# Patient Record
Sex: Female | Born: 1980 | Race: Black or African American | Hispanic: No | Marital: Single | Smoking: Never smoker
Health system: Southern US, Community
[De-identification: ages and names within clinical notes are randomized; demographics above are authoritative.]

## PROBLEM LIST (undated history)

## (undated) DIAGNOSIS — G4730 Sleep apnea, unspecified: Secondary | ICD-10-CM

## (undated) DIAGNOSIS — T7840XA Allergy, unspecified, initial encounter: Secondary | ICD-10-CM

## (undated) DIAGNOSIS — D649 Anemia, unspecified: Secondary | ICD-10-CM

## (undated) HISTORY — DX: Anemia, unspecified: D64.9

## (undated) HISTORY — DX: Allergy, unspecified, initial encounter: T78.40XA

## (undated) HISTORY — DX: Sleep apnea, unspecified: G47.30

## (undated) HISTORY — PX: HAND SURGERY: SHX662

---

## 2003-09-12 ENCOUNTER — Other Ambulatory Visit: Admission: RE | Admit: 2003-09-12 | Discharge: 2003-09-12 | Payer: Self-pay | Admitting: Gynecology

## 2004-01-07 ENCOUNTER — Other Ambulatory Visit: Admission: RE | Admit: 2004-01-07 | Discharge: 2004-01-07 | Payer: Self-pay | Admitting: Gynecology

## 2005-02-24 ENCOUNTER — Other Ambulatory Visit: Admission: RE | Admit: 2005-02-24 | Discharge: 2005-02-24 | Payer: Self-pay | Admitting: Gynecology

## 2005-03-17 ENCOUNTER — Other Ambulatory Visit: Admission: RE | Admit: 2005-03-17 | Discharge: 2005-03-17 | Payer: Self-pay | Admitting: Gynecology

## 2005-03-25 ENCOUNTER — Encounter: Admission: RE | Admit: 2005-03-25 | Discharge: 2005-03-25 | Payer: Self-pay | Admitting: Gynecology

## 2005-04-15 ENCOUNTER — Encounter: Admission: RE | Admit: 2005-04-15 | Discharge: 2005-04-15 | Payer: Self-pay | Admitting: Gynecology

## 2005-12-24 ENCOUNTER — Other Ambulatory Visit: Admission: RE | Admit: 2005-12-24 | Discharge: 2005-12-24 | Payer: Self-pay | Admitting: Obstetrics and Gynecology

## 2006-02-05 ENCOUNTER — Ambulatory Visit (HOSPITAL_BASED_OUTPATIENT_CLINIC_OR_DEPARTMENT_OTHER): Admission: RE | Admit: 2006-02-05 | Discharge: 2006-02-05 | Payer: Self-pay | Admitting: Surgery

## 2006-02-05 ENCOUNTER — Encounter (INDEPENDENT_AMBULATORY_CARE_PROVIDER_SITE_OTHER): Payer: Self-pay | Admitting: *Deleted

## 2007-08-14 ENCOUNTER — Ambulatory Visit: Payer: Self-pay | Admitting: Internal Medicine

## 2008-01-30 ENCOUNTER — Ambulatory Visit: Payer: Self-pay | Admitting: Family Medicine

## 2009-05-07 IMAGING — US ABDOMEN ULTRASOUND
1 series · 17 of 25 positions shown · non-contrast
Comparison: none

REASON FOR EXAM: LUQ Pain Pelvic Pain
COMMENTS:

[Series 1: abdomen ultrasound · 17 of 65 slices shown]
[im 1/65]
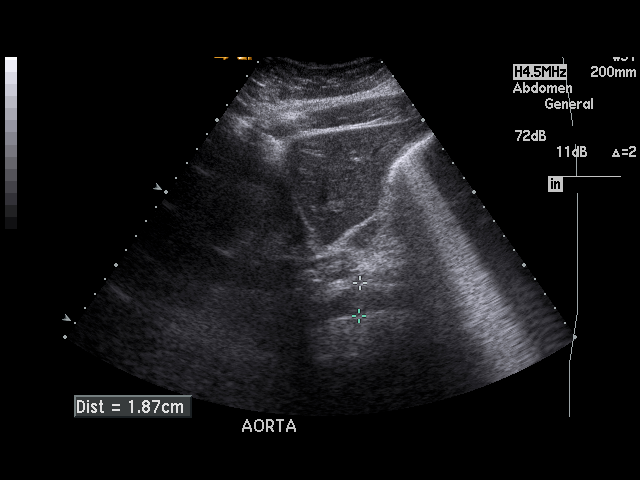
[im 6/65]
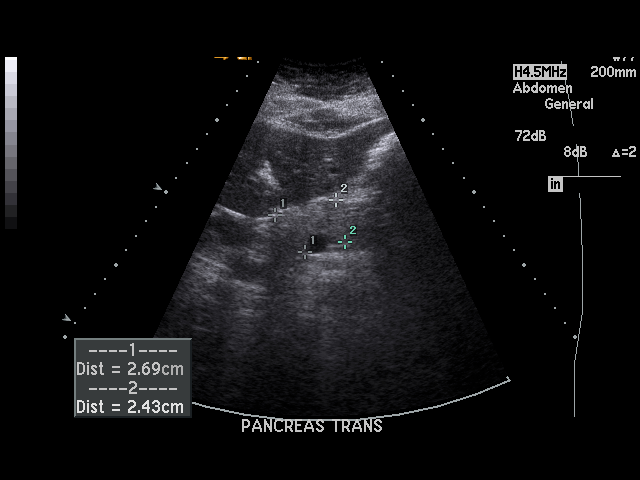
[im 9/65]
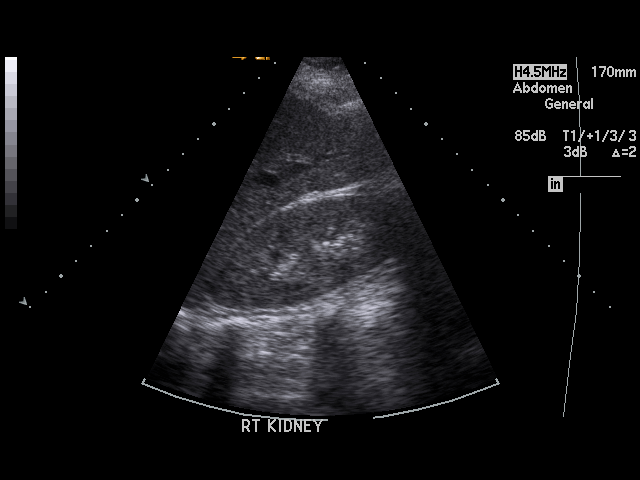
[im 14/65]
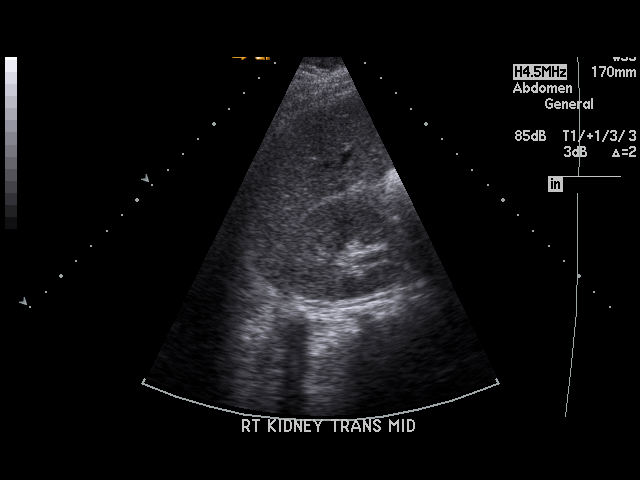
[im 17/65]
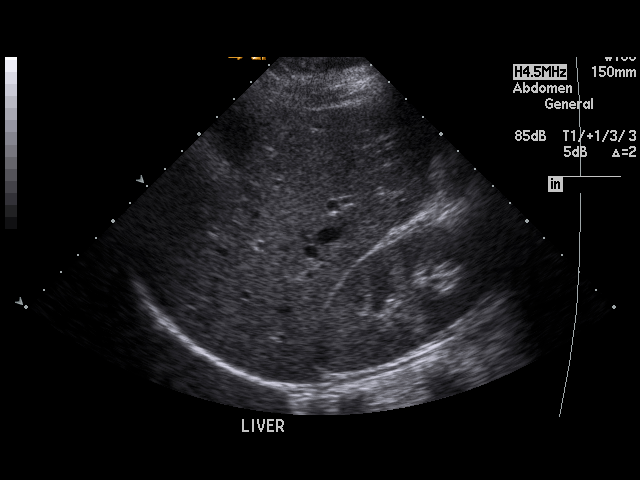
[im 22/65]
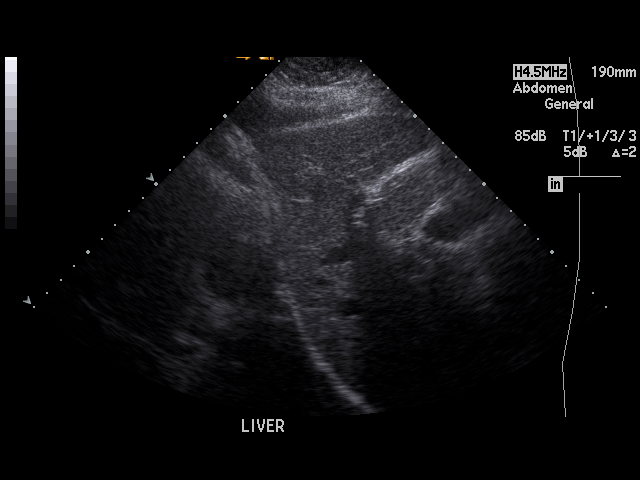
[im 25/65]
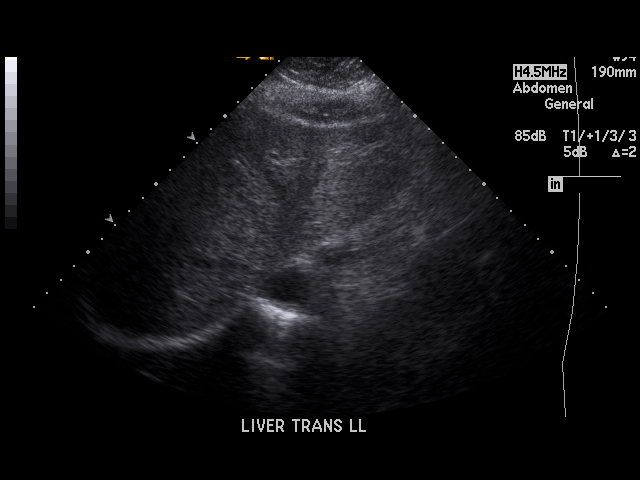
[im 30/65]
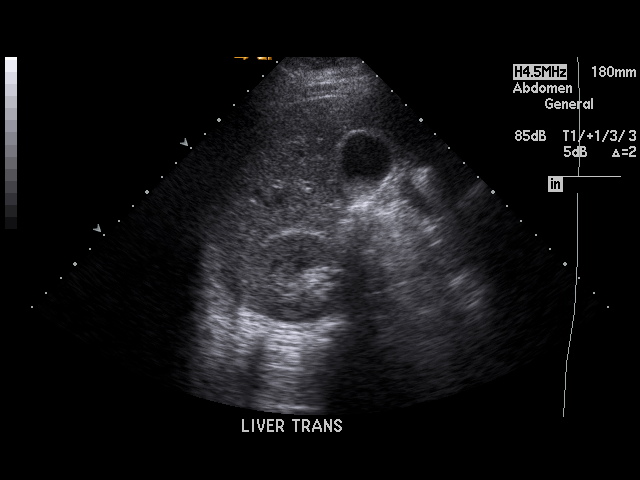
[im 33/65]
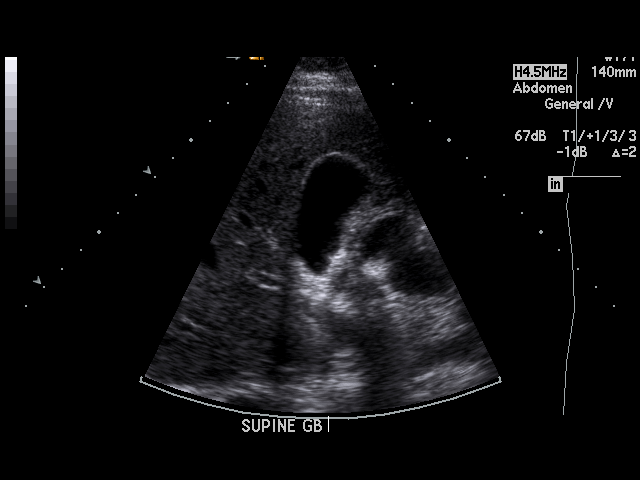
[im 35/65]
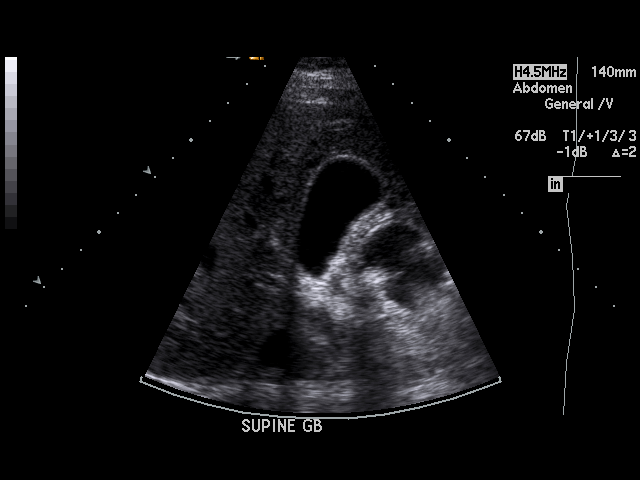
[im 41/65]
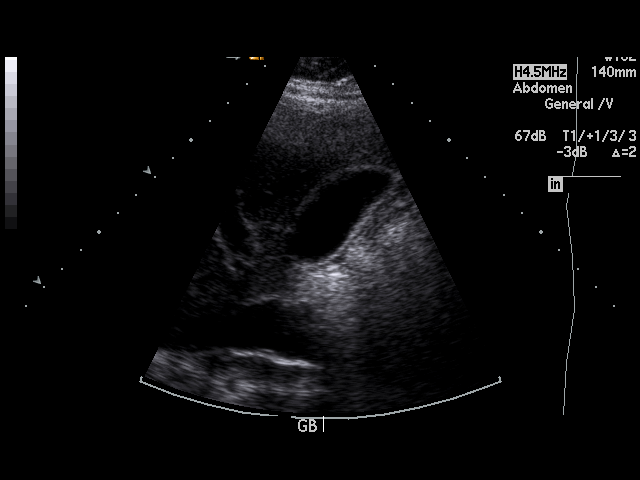
[im 43/65]
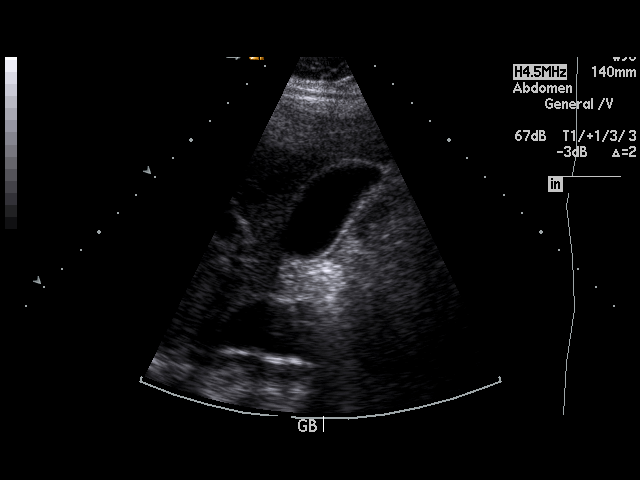
[im 49/65]
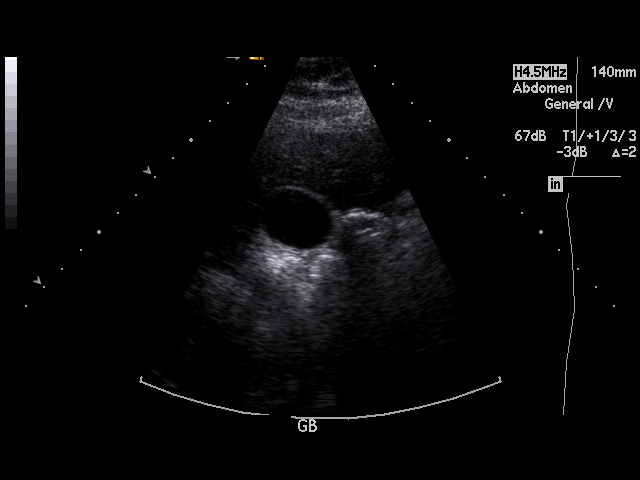
[im 51/65]
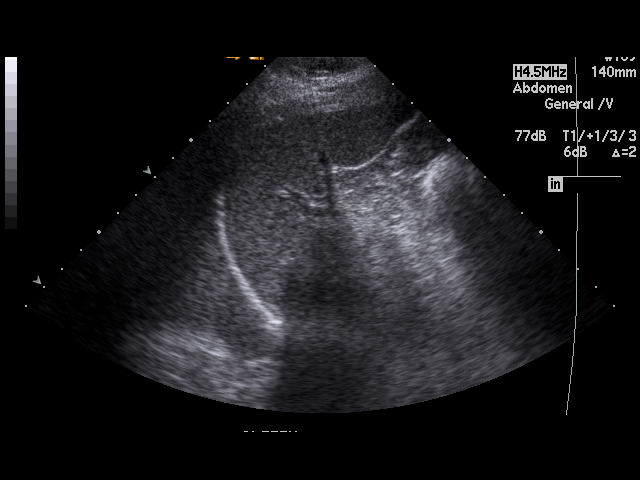
[im 57/65]
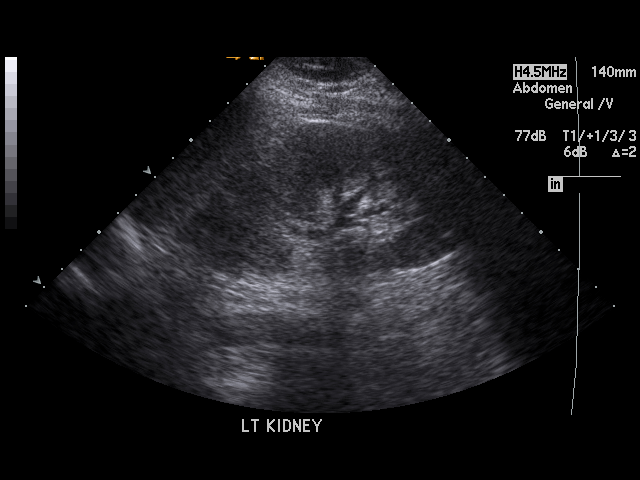
[im 59/65]
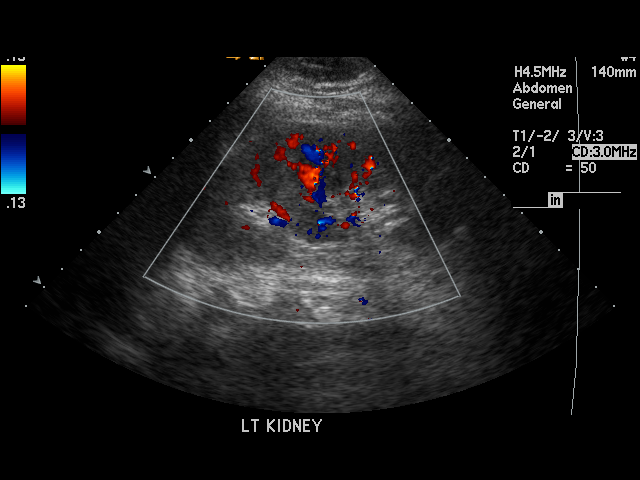
[im 65/65]
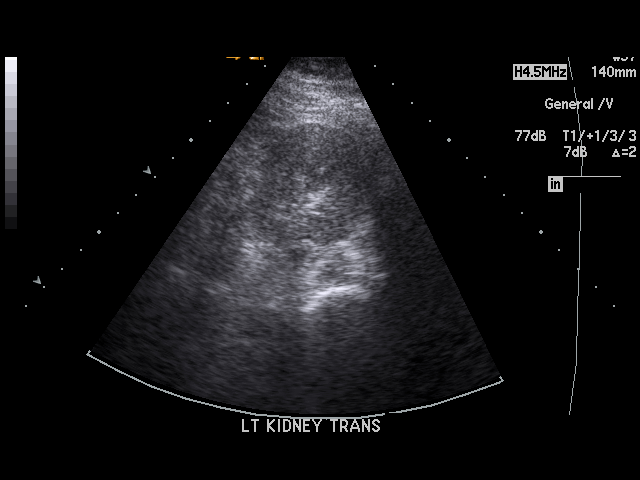

[17 of 25 positions shown; findings below may reference images not displayed]

PROCEDURE:     US  - US ABDOMEN GENERAL SURVEY  - January 30, 2008  [DATE]

RESULT:     The liver, spleen, pancreas, abdominal aorta and inferior vena
cava show no significant abnormalities. No gallstones are seen. There is no
thickening of the gallbladder wall. The common bile duct measures 2.9 mm in
diameter which is within normal limits. The kidneys show no hydronephrosis.
There is no ascites.
IMPRESSION: 1.     No significant abnormalities are noted.

## 2009-05-07 IMAGING — US US PELV - US TRANSVAGINAL
1 series · 17 of 25 positions shown · non-contrast
Comparison: none

REASON FOR EXAM: LUQ Pain Pelvic Pain
COMMENTS:

[Series 1: us pelv - us transvaginal · 17 of 47 slices shown]
[im 1/47]
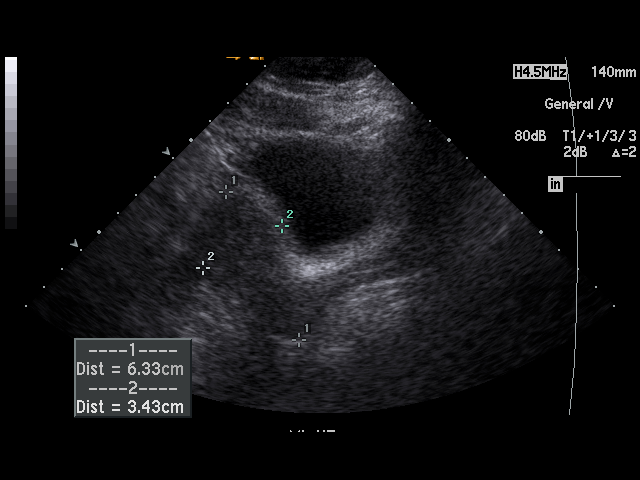
[im 4/47]
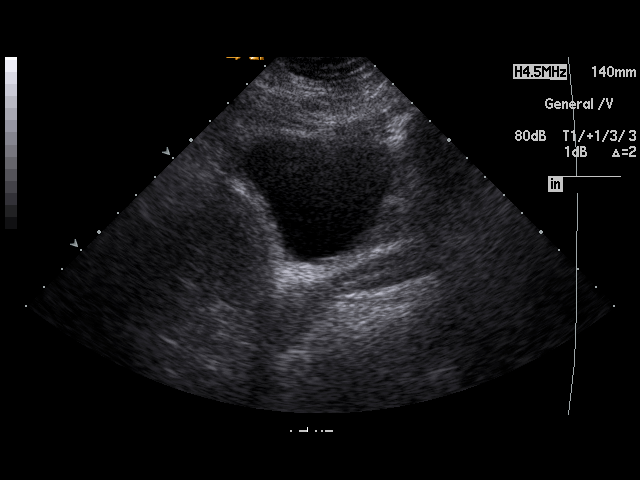
[im 6/47]
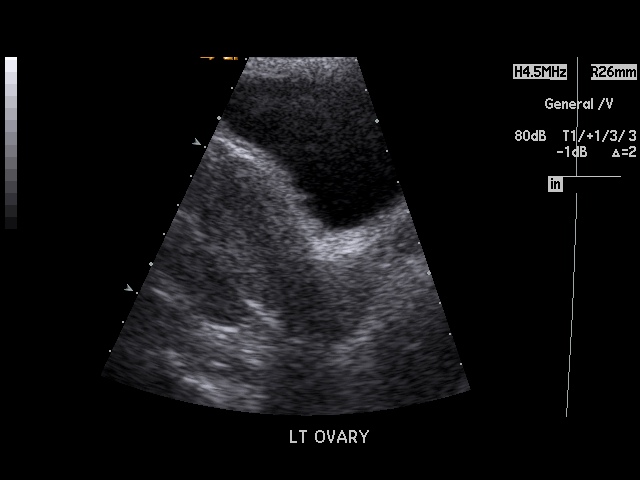
[im 10/47]
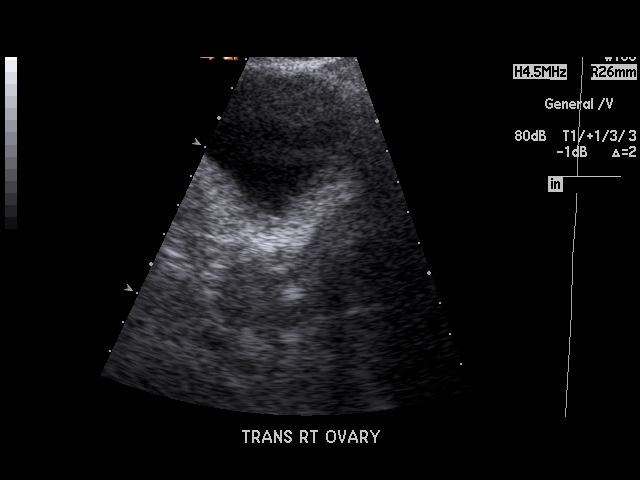
[im 12/47]
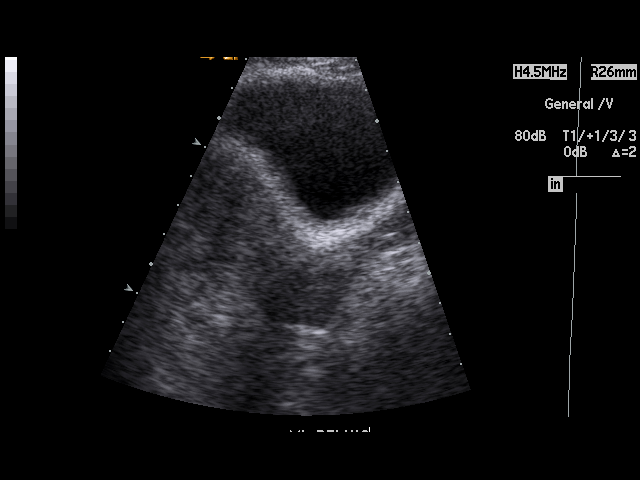
[im 16/47]
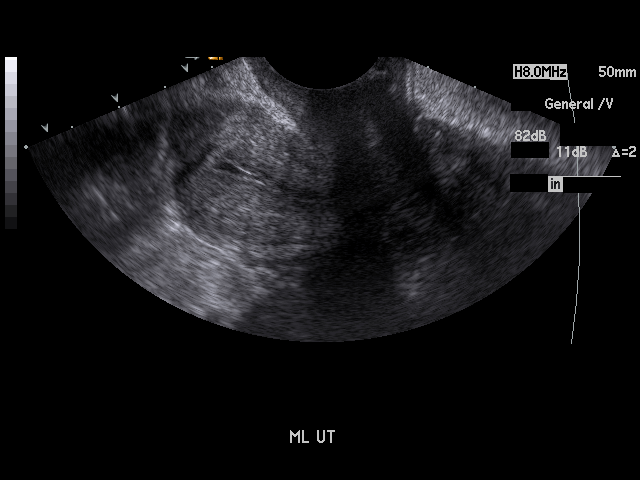
[im 18/47]
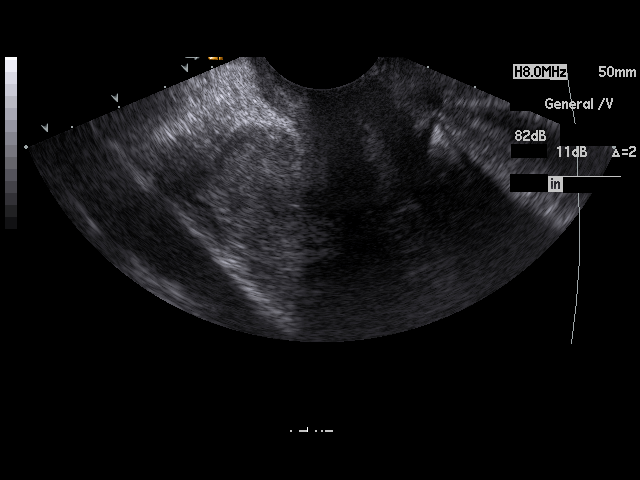
[im 22/47]
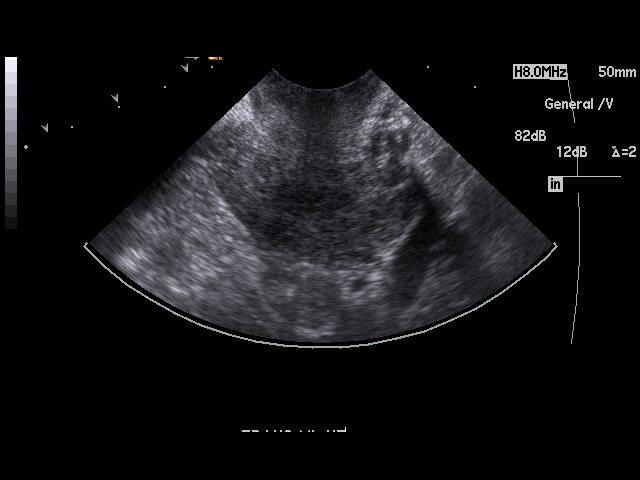
[im 24/47]
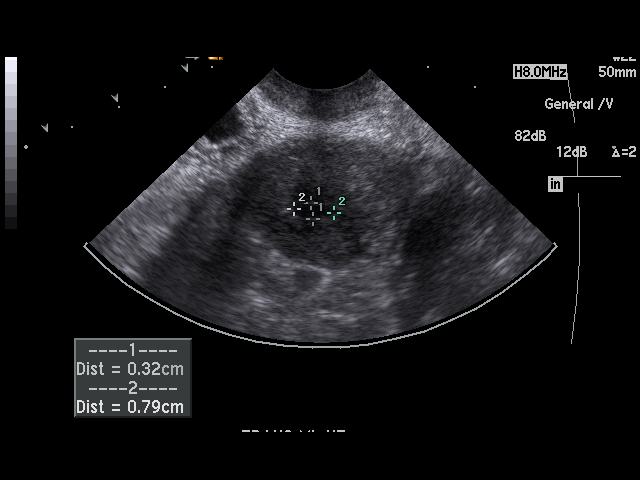
[im 25/47]
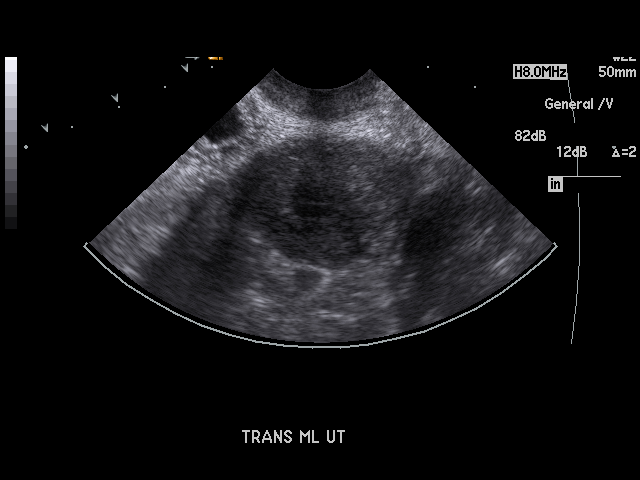
[im 29/47]
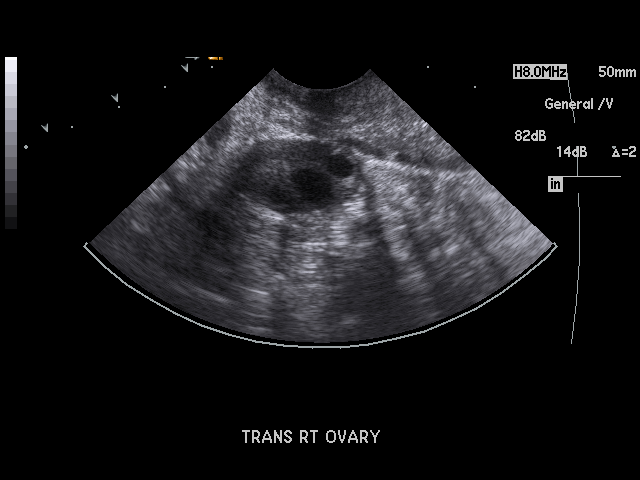
[im 31/47]
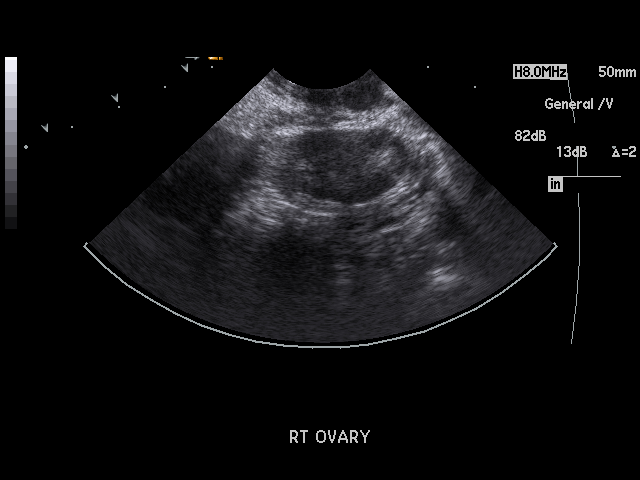
[im 35/47]
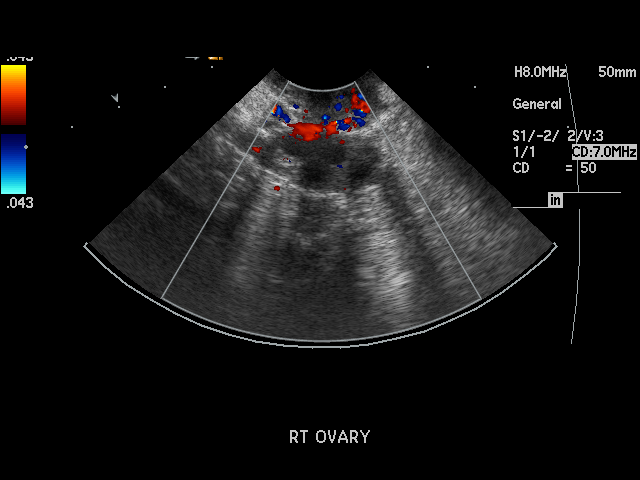
[im 37/47]
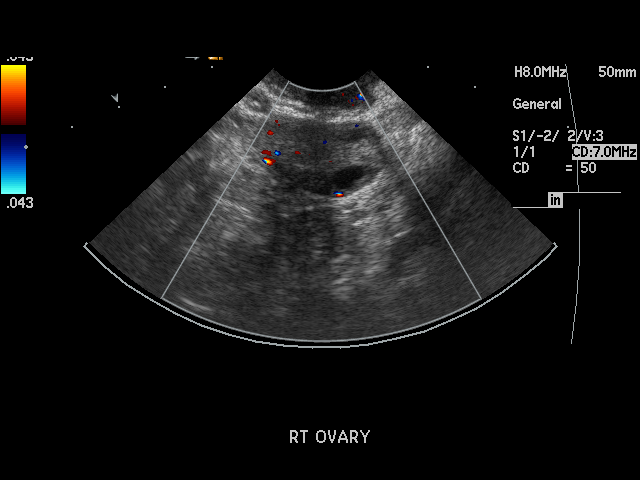
[im 41/47]
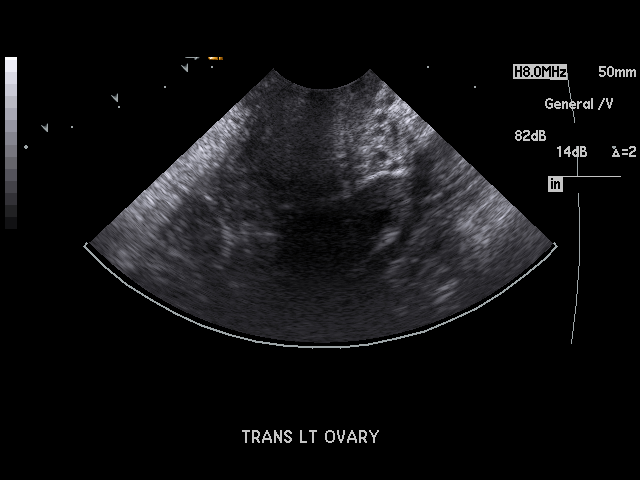
[im 43/47]
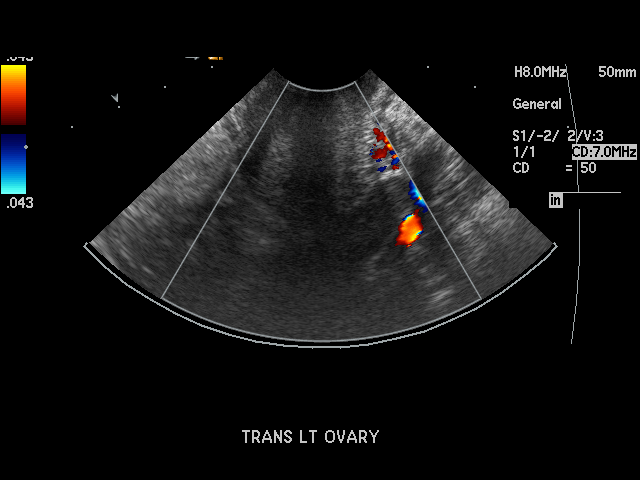
[im 47/47]
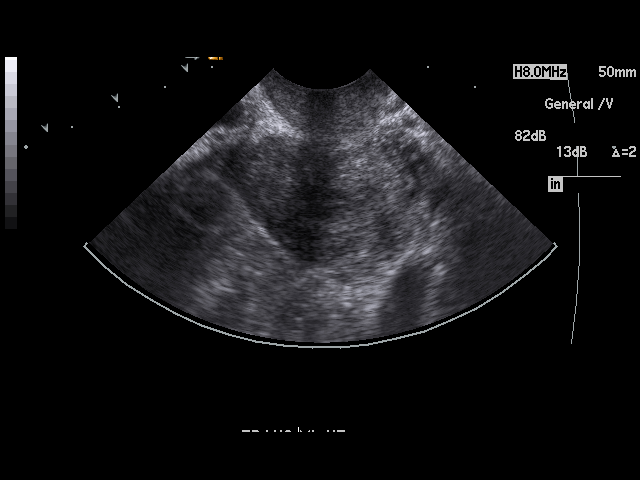

[17 of 25 positions shown; findings below may reference images not displayed]

PROCEDURE:     US  - US PELVIS MASS EXAM  - [DATE]  [DATE] [DATE]  [DATE]

RESULT:     Transabdominal and endovaginal ultrasound was performed. The
uterus measures 6.33 cm sagittally and 3.43 cm in AP diameter. No uterine
mass lesions are seen. The endometrium measures 1.2 mm in thickness. On the
endovaginal views there is noted a small amount of fluid in the endometrial
cavity. The RIGHT and LEFT ovaries are visualized. The RIGHT ovary measures
2.49 cm at maximum diameter and the LEFT ovary measures 2.43 cm at maximum
diameter. No abnormal adnexal masses are seen. A few follicular cysts are
present in the ovaries. The visualized portion of the urinary bladder is
normal in appearance. No ascites is seen.
IMPRESSION: 1. No significant abnormalities are noted.
2. Incidental note is made of a small amount of fluid in the endometrial
cavity.

## 2009-11-08 ENCOUNTER — Emergency Department (HOSPITAL_COMMUNITY): Admission: EM | Admit: 2009-11-08 | Discharge: 2009-11-08 | Payer: Self-pay | Admitting: Emergency Medicine

## 2011-01-12 ENCOUNTER — Other Ambulatory Visit: Payer: Self-pay | Admitting: Obstetrics and Gynecology

## 2011-02-13 NOTE — Op Note (Signed)
[name], Rhonda Phillips             ACCOUNT NO.:  [id number]   MEDICAL RECORD NO.:  [id number]          PATIENT TYPE:  AMB   LOCATION:  [facility]                          FACILITY:  [facility]   PHYSICIAN:  Wilmon Arms. Corliss Skains, M.D. DATE OF BIRTH:  10-10-1980   DATE OF PROCEDURE:  02/05/2006  DATE OF DISCHARGE:                                 OPERATIVE REPORT   PREOPERATIVE DIAGNOSIS:  Prolapsed internal hemorrhoid.   POSTOPERATIVE DIAGNOSIS:  Prolapsed internal hemorrhoid.   PROCEDURE PERFORMED:  Examination under anesthesia and hemorrhoidectomy.   SURGEON:  Wilmon Arms. Corliss Skains, M.D.   ASSISTANT:  None.   ANESTHESIA:  General via LMA.   INDICATIONS:  The patient is a 30 year old female who presented to her  gynecologist for a routine Pap smear.  She was noted to have a very large  prolapsed hemorrhoid and was referred for surgical evaluation.  The patient  states that it has been there for several years.  She occasionally has some  hematochezia.  Occasionally this hemorrhoid becomes larger.  The patient was  not having any symptoms at the time.  However, due to the large size, I  recommended hemorrhoidectomy.   DESCRIPTION OF PROCEDURE:  The patient brought was brought to the operating  room and placed in the supine position on the operating room table.  After  an adequate level general anesthesia was obtained, the patient's legs were  placed in yellow fin stirrups in lithotomy position.  Her perineum was  prepped with Betadine and draped in a sterile fashion.  A time-out was taken  assure proper patient, proper procedure.  A very large anterior prolapsed  hemorrhoid was noted.  The anus was dilated up to three fingers.  The silver  bullet retractor was inserted and circumferential inspection of the internal  mucosa showed only the large anterior hemorrhoid.  There was very little in  the way of external hemorrhoid disease.  The anterior prolapsed hemorrhoid  was grasped with a Penning[place]  clamp.  A transfixing suture of 3-0 Vicryl was  placed at the apex of the hemorrhoid.  The mucosa and the perianal skin were  scored with the knife.  Cautery was used to remove the entire hemorrhoid off  of the sphincter muscle.  This was sent for pathologic examination.  Hemostasis was obtained with cautery.  We then used a running locking suture  of 3-0 Vicryl to close the mucosal edges back together.  Hemostasis was  excellent.  The most exterior 0.5 cm was left open to allow drainage.  Gelfoam was then packed into the anal canal.  The patient's legs were  lowered out of lithotomy position and she was extubated, brought to recovery  in stable condition.  All sponge, instrument and needle counts were correct.     Wilmon Arms. Tsuei, M.D.  Electronically Signed    MKT/MEDQ  D:  02/05/2006  T:  02/06/2006  Job:  [id number]

## 2013-01-30 ENCOUNTER — Other Ambulatory Visit: Payer: Self-pay | Admitting: Obstetrics and Gynecology

## 2013-09-28 DIAGNOSIS — G4733 Obstructive sleep apnea (adult) (pediatric): Secondary | ICD-10-CM | POA: Insufficient documentation

## 2013-11-08 DIAGNOSIS — G5600 Carpal tunnel syndrome, unspecified upper limb: Secondary | ICD-10-CM | POA: Insufficient documentation

## 2014-02-02 DIAGNOSIS — G43909 Migraine, unspecified, not intractable, without status migrainosus: Secondary | ICD-10-CM | POA: Insufficient documentation

## 2014-02-02 DIAGNOSIS — G4730 Sleep apnea, unspecified: Secondary | ICD-10-CM | POA: Insufficient documentation

## 2014-02-05 ENCOUNTER — Other Ambulatory Visit: Payer: Self-pay | Admitting: Obstetrics and Gynecology

## 2014-03-01 ENCOUNTER — Ambulatory Visit (INDEPENDENT_AMBULATORY_CARE_PROVIDER_SITE_OTHER): Payer: 59 | Admitting: Family Medicine

## 2014-03-01 ENCOUNTER — Ambulatory Visit: Payer: 59

## 2014-03-01 VITALS — BP 122/80 | HR 113 | Temp 98.4°F | Resp 18 | Ht 63.0 in | Wt 273.8 lb

## 2014-03-01 DIAGNOSIS — M79609 Pain in unspecified limb: Secondary | ICD-10-CM

## 2014-03-01 DIAGNOSIS — M79672 Pain in left foot: Secondary | ICD-10-CM

## 2014-03-01 DIAGNOSIS — S9032XA Contusion of left foot, initial encounter: Secondary | ICD-10-CM

## 2014-03-01 DIAGNOSIS — S9030XA Contusion of unspecified foot, initial encounter: Secondary | ICD-10-CM

## 2014-03-01 NOTE — Patient Instructions (Signed)
You appear to have a contusion of your foot- I do not see a fracture.  Use the "post-op" shoe as needed, and the crutches as needed.  Ice and elevate your foot, and use ibuprofen or tylenol as needed.  Let me know if your foot is not back to normal in the next few days

## 2014-03-01 NOTE — Progress Notes (Signed)
[facility] [address] [place] [address] [phone]  Date:  03/01/2014   Name:  Rhonda Phillips   DOB:  02/18/1981   MRN:  [id number]  PCP:  No primary provider on file.    Chief Complaint: Foot Pain   History of Present Illness:  Rhonda Phillips is a 33 y.o. very pleasant female patient who presents with the following:  Yesterday while getting out of a pool she hit her right foot- she had pain right away and is having a hard time walking.  She was doing water aerobics for exercise in an indoor pool. She is still limping today.  She is able to walk but it is difficutlt.  She is otherwise unhurt and has not other concerns today  She has an IUD  There are no active problems to display for this patient.   Past Medical History  Diagnosis Date  . Allergy   . Anemia   . Sleep apnea     Past Surgical History  Procedure Laterality Date  . Hand surgery      History  Substance Use Topics  . Smoking status: Never Smoker   . Smokeless tobacco: Not on file  . Alcohol Use: Not on file    Family History  Problem Relation Age of Onset  . Heart disease Maternal Grandmother   . Hyperlipidemia Maternal Grandmother     Allergies  Allergen Reactions  . Biaxin [Clarithromycin] Swelling    Medication list has been reviewed and updated.  No current outpatient prescriptions on file prior to visit.   No current facility-administered medications on file prior to visit.    Review of Systems:  As per HPI- otherwise negative.   Physical Examination: Filed Vitals:   03/01/14 1727  BP: 122/80  Pulse: 113  Temp: 98.4 F (36.9 C)  Resp: 18   Filed Vitals:   03/01/14 1727  Height: 5\' 3"  (1.6 m)  Weight: 273 lb 12.8 oz (124.195 kg)   Body mass index is 48.51 kg/(m^2). Ideal Body Weight: Weight in (lb) to have BMI = 25: 140.8  GEN: WDWN, NAD, Non-toxic, A & O x 3, obese, looks well HEENT: Atraumatic, Normocephalic. Neck supple. No masses, No  LAD. Ears and Nose: No external deformity. CV: RRR, No M/G/R. No JVD. No thrill. No extra heart sounds. PULM: CTA B, no wheezes, crackles, rhonchi. No retractions. No resp. distress. No accessory muscle use. EXTR: No c/c/e NEURO favoring left leg minimally  PSYCH: Normally interactive. Conversant. Not depressed or anxious appearing.  Calm demeanor.  Left foot: she is tender over the 1st and 2nd MT, dorsal part.  Ankle and knee negative.  No swelling, redness or bruise visible.  Able to move and feel her toes normally, normal DP pulse  [facility] reading (PRIMARY) by  Dr. Patsy Lager. Left foot: negative for fracture or dislocation.  She does have a calcifiation on the lateal- ?part of the plantar fascia. She did have plantar fascia surgery in 2010.     LEFT FOOT - COMPLETE 3+ VIEW  COMPARISON: None.  FINDINGS: There is no evidence of fracture or dislocation. There is no evidence of arthropathy or other focal bone abnormality. Soft tissues are unremarkable.  IMPRESSION: Negative.  Assessment and Plan: Contusion of left foot - Plan: DG Foot Complete Left  Pain in left foot  Contusion left foot.  No evidence of fracture.  Placed in post-op shoe and given crutches See patient instructions for more details.  Signed Lamar Blinks, MD

## 2014-03-06 ENCOUNTER — Ambulatory Visit (INDEPENDENT_AMBULATORY_CARE_PROVIDER_SITE_OTHER): Payer: 59 | Admitting: Internal Medicine

## 2014-03-06 VITALS — BP 120/76 | HR 94 | Temp 98.2°F | Resp 18 | Ht 63.0 in | Wt 273.0 lb

## 2014-03-06 DIAGNOSIS — M79609 Pain in unspecified limb: Secondary | ICD-10-CM

## 2014-03-06 DIAGNOSIS — R269 Unspecified abnormalities of gait and mobility: Secondary | ICD-10-CM

## 2014-03-06 DIAGNOSIS — S9030XA Contusion of unspecified foot, initial encounter: Secondary | ICD-10-CM

## 2014-03-06 DIAGNOSIS — M79673 Pain in unspecified foot: Secondary | ICD-10-CM

## 2014-03-06 NOTE — Patient Instructions (Signed)
RICE: Routine Care for Injuries The routine care of many injuries includes Rest, Ice, Compression, and Elevation (RICE). HOME CARE INSTRUCTIONS  Rest is needed to allow your body to heal. Routine activities can usually be resumed when comfortable. Injured tendons and bones can take up to 6 weeks to heal. Tendons are the cord-like structures that attach muscle to bone.  Ice following an injury helps keep the swelling down and reduces pain.  Put ice in a plastic bag.  Place a towel between your skin and the bag.  Leave the ice on for 15-20 minutes, 03-04 times a day. Do this while awake, for the first 24 to 48 hours. After that, continue as directed by your caregiver.  Compression helps keep swelling down. It also gives support and helps with discomfort. If an elastic bandage has been applied, it should be removed and reapplied every 3 to 4 hours. It should not be applied tightly, but firmly enough to keep swelling down. Watch fingers or toes for swelling, bluish discoloration, coldness, numbness, or excessive pain. If any of these problems occur, remove the bandage and reapply loosely. Contact your caregiver if these problems continue.  Elevation helps reduce swelling and decreases pain. With extremities, such as the arms, hands, legs, and feet, the injured area should be placed near or above the level of the heart, if possible. SEEK IMMEDIATE MEDICAL CARE IF:  You have persistent pain and swelling.  You develop redness, numbness, or unexpected weakness.  Your symptoms are getting worse rather than improving after several days. These symptoms may indicate that further evaluation or further X-rays are needed. Sometimes, X-rays may not show a small broken bone (fracture) until 1 week or 10 days later. Make a follow-up appointment with your caregiver. Ask when your X-ray results will be ready. Make sure you get your X-ray results. Document Released: 12/27/2000 Document Revised: 12/07/2011  Document Reviewed: 02/13/2011 [facility] Patient Information 2014 [place], [place]. Immunization Schedule, Adult  Influenza vaccine.  All adults should be immunized every year.  All adults, including pregnant women and people with hives-only allergy to eggs can receive the inactivated influenza (IIV) vaccine.  Adults aged 52 49 years can receive the recombinant influenza (RIV) vaccine. The RIV vaccine does not contain any egg protein.  Adults aged 46 years or older can receive the standard-dose IIV or the high-dose IIV.  Tetanus, diphtheria, and acellular pertussis (Td, Tdap) vaccine.  Pregnant women should receive 1 dose of Tdap vaccine during each pregnancy. The dose should be obtained regardless of the length of time since the last dose. Immunization is preferred during the 27th to 36th week of gestation.  An adult who has not previously received Tdap or who does not know his or her vaccine status should receive 1 dose of Tdap. This initial dose should be followed by tetanus and diphtheria toxoids (Td) booster doses every 10 years.  Adults with an unknown or incomplete history of completing a 3-dose immunization series with Td-containing vaccines should begin or complete a primary immunization series including a Tdap dose.  Adults should receive a Td booster every 10 years.  Varicella vaccine.  An adult without evidence of immunity to varicella should receive 2 doses or a second dose if he or she has previously received 1 dose.  Pregnant females who do not have evidence of immunity should receive the first dose after pregnancy. This first dose should be obtained before leaving the health care facility. The second dose should be obtained 4 8 weeks  after the first dose.  Human papillomavirus (HPV) vaccine.  Females aged 40 26 years who have not received the vaccine previously should obtain the 3-dose series.  The vaccine is not recommended for use in pregnant females. However,  pregnancy testing is not needed before receiving a dose. If a female is found to be pregnant after receiving a dose, no treatment is needed. In that case, the remaining doses should be delayed until after the pregnancy.  Males aged 20 21 years who have not received the vaccine previously should receive the 3-dose series. Males aged 17 26 years may be immunized.  Immunization is recommended through the age of 54 years for any female who has sex with males and did not get any or all doses earlier.  Immunization is recommended for any person with an immunocompromised condition through the age of 55 years if he or she did not get any or all doses earlier.  During the 3-dose series, the second dose should be obtained 4 8 weeks after the first dose. The third dose should be obtained 24 weeks after the first dose and 16 weeks after the second dose.  Zoster vaccine.  One dose is recommended for adults aged 25 years or older unless certain conditions are present.  Measles, mumps, and rubella (MMR) vaccine.  Adults born before 30 generally are considered immune to measles and mumps.  Adults born in [birth year] or later should have 1 or more doses of MMR vaccine unless there is a contraindication to the vaccine or there is laboratory evidence of immunity to each of the three diseases.  A routine second dose of MMR vaccine should be obtained at least 28 days after the first dose for students attending postsecondary schools, health care workers, or international travelers.  People who received inactivated measles vaccine or an unknown type of measles vaccine during 1963 1967 should receive 2 doses of MMR vaccine.  People who received inactivated mumps vaccine or an unknown type of mumps vaccine before 1979 and are at high risk for mumps infection should consider immunization with 2 doses of MMR vaccine.  For females of childbearing age, rubella immunity should be determined. If there is no evidence of  immunity, females who are not pregnant should be vaccinated. If there is no evidence of immunity, females who are pregnant should delay immunization until after pregnancy.  Unvaccinated health care workers born before [birth year] who lack laboratory evidence of measles, mumps, or rubella immunity or laboratory confirmation of disease should consider measles and mumps immunization with 2 doses of MMR vaccine or rubella immunization with 1 dose of MMR vaccine.  Pneumococcal 13-valent conjugate (PCV13) vaccine.  When indicated, a person who is uncertain of his or her immunization history and has no record of immunization should receive the PCV13 vaccine.  An adult aged 49 years or older who has certain medical conditions and has not been previously immunized should receive 1 dose of PCV13 vaccine. This PCV13 should be followed with a dose of pneumococcal polysaccharide (PPSV23) vaccine. The PPSV23 vaccine dose should be obtained at least 8 weeks after the dose of PCV13 vaccine.  An adult aged 1 years or older who has certain medical conditions and previously received 1 or more doses of PPSV23 vaccine should receive 1 dose of PCV13. The PCV13 vaccine dose should be obtained 1 or more years after the last PPSV23 vaccine dose.  Pneumococcal polysaccharide (PPSV23) vaccine.  When PCV13 is also indicated, PCV13 should be obtained first.  All  adults aged 33 years and older should be immunized.  An adult younger than age 59 years who has certain medical conditions should be immunized.  Any person who resides in a nursing home or long-term care facility should be immunized.  An adult smoker should be immunized.  People with an immunocompromised condition and certain other conditions should receive both PCV13 and PPSV23 vaccines.  People with human immunodeficiency virus (HIV) infection should be immunized as soon as possible after diagnosis.  Immunization during chemotherapy or radiation therapy should be  avoided.  Routine use of PPSV23 vaccine is not recommended for American Indians, [place] Natives, or people younger than 65 years unless there are medical conditions that require PPSV23 vaccine.  When indicated, people who have unknown immunization and have no record of immunization should receive PPSV23 vaccine.  One-time revaccination 5 years after the first dose of PPSV23 is recommended for people aged 29 64 years who have chronic kidney failure, nephrotic syndrome, asplenia, or immunocompromised conditions.  People who received 1 2 doses of PPSV23 before age 81 years should receive another dose of PPSV23 vaccine at age 22 years or later if at least 5 years have passed since the previous dose.  Doses of PPSV23 are not needed for people immunized with PPSV23 at or after age 62 years.  Meningococcal vaccine.  Adults with asplenia or persistent complement component deficiencies should receive 2 doses of quadrivalent meningococcal conjugate (MenACWY-D) vaccine. The doses should be obtained at least 2 months apart.  Microbiologists working with certain meningococcal bacteria, [place] recruits, people at risk during an outbreak, and people who travel to or live in countries with a high rate of meningitis should be immunized.  A first-year college student up through age 37 years who is living in a residence hall should receive a dose if he or she did not receive a dose on or after his or her 16th birthday.  Adults who have certain high-risk conditions should receive one or more doses of vaccine.  Hepatitis A vaccine.  Adults who wish to be protected from this disease, have certain high-risk conditions, work with hepatitis A-infected animals, work in hepatitis A research labs, or travel to or work in countries with a high rate of hepatitis A should be immunized.  Adults who were previously unvaccinated and who anticipate close contact with an international adoptee during the first 60 days after  arrival in the [place] from a country with a high rate of hepatitis A should be immunized.  Hepatitis B vaccine.  Adults who wish to be protected from this disease, have certain high-risk conditions, may be exposed to blood or other infectious body fluids, are household contacts or sex partners of hepatitis B positive people, are clients or workers in certain care facilities, or travel to or work in countries with a high rate of hepatitis B should be immunized.  Haemophilus influenzae type b (Hib) vaccine.  A previously unvaccinated person with asplenia or sickle cell disease or having a scheduled splenectomy should receive 1 dose of Hib vaccine.  Regardless of previous immunization, a recipient of a hematopoietic stem cell transplant should receive a 3-dose series 6 12 months after his or her successful transplant.  Hib vaccine is not recommended for adults with HIV infection. Document Released: 12/05/2003 Document Revised: 01/09/2013 Document Reviewed: 11/01/2012 [facility] Patient Information 2014 [place], [place].

## 2014-03-06 NOTE — Progress Notes (Signed)
   Subjective:    Patient ID: Rhonda Phillips, female    DOB: 02-Sep-1981, 33 y.o.   MRN: [id number]  HPI Foot still hurting dorsal, medial. On crutches and difficult to use.XR was negative for fx. She has full rom and sensation.   Review of Systems     Objective:   Physical Exam  Constitutional: She appears well-nourished. No distress.  HENT:  Head: Normocephalic.  Eyes: EOM are normal. Pupils are equal, round, and reactive to light.  Neck: Normal range of motion.  Pulmonary/Chest: Effort normal.  Musculoskeletal: She exhibits tenderness.       Left ankle: She exhibits swelling and ecchymosis. She exhibits normal range of motion, no deformity and normal pulse. Tenderness. No medial malleolus tenderness found.       Left foot: She exhibits tenderness, bony tenderness and swelling. She exhibits normal range of motion, normal capillary refill, no crepitus, no deformity and no laceration.       Feet:  Neurological: She has normal strength. No sensory deficit. Gait abnormal.  Foot NMSV intact          Assessment & Plan:  Contusion/Pain left dorsal foot Abnormal gait Camwalker fitted and trained Return 7-10d

## 2015-03-07 ENCOUNTER — Other Ambulatory Visit: Payer: Self-pay | Admitting: Obstetrics and Gynecology

## 2015-03-08 LAB — CYTOLOGY - PAP

## 2015-06-07 IMAGING — CR DG FOOT COMPLETE 3+V*L*
3 series · 3 of 3 positions shown · non-contrast
Comparison: None.

CLINICAL DATA: Left foot pain

EXAM:
LEFT FOOT - COMPLETE 3+ VIEW

[AP]
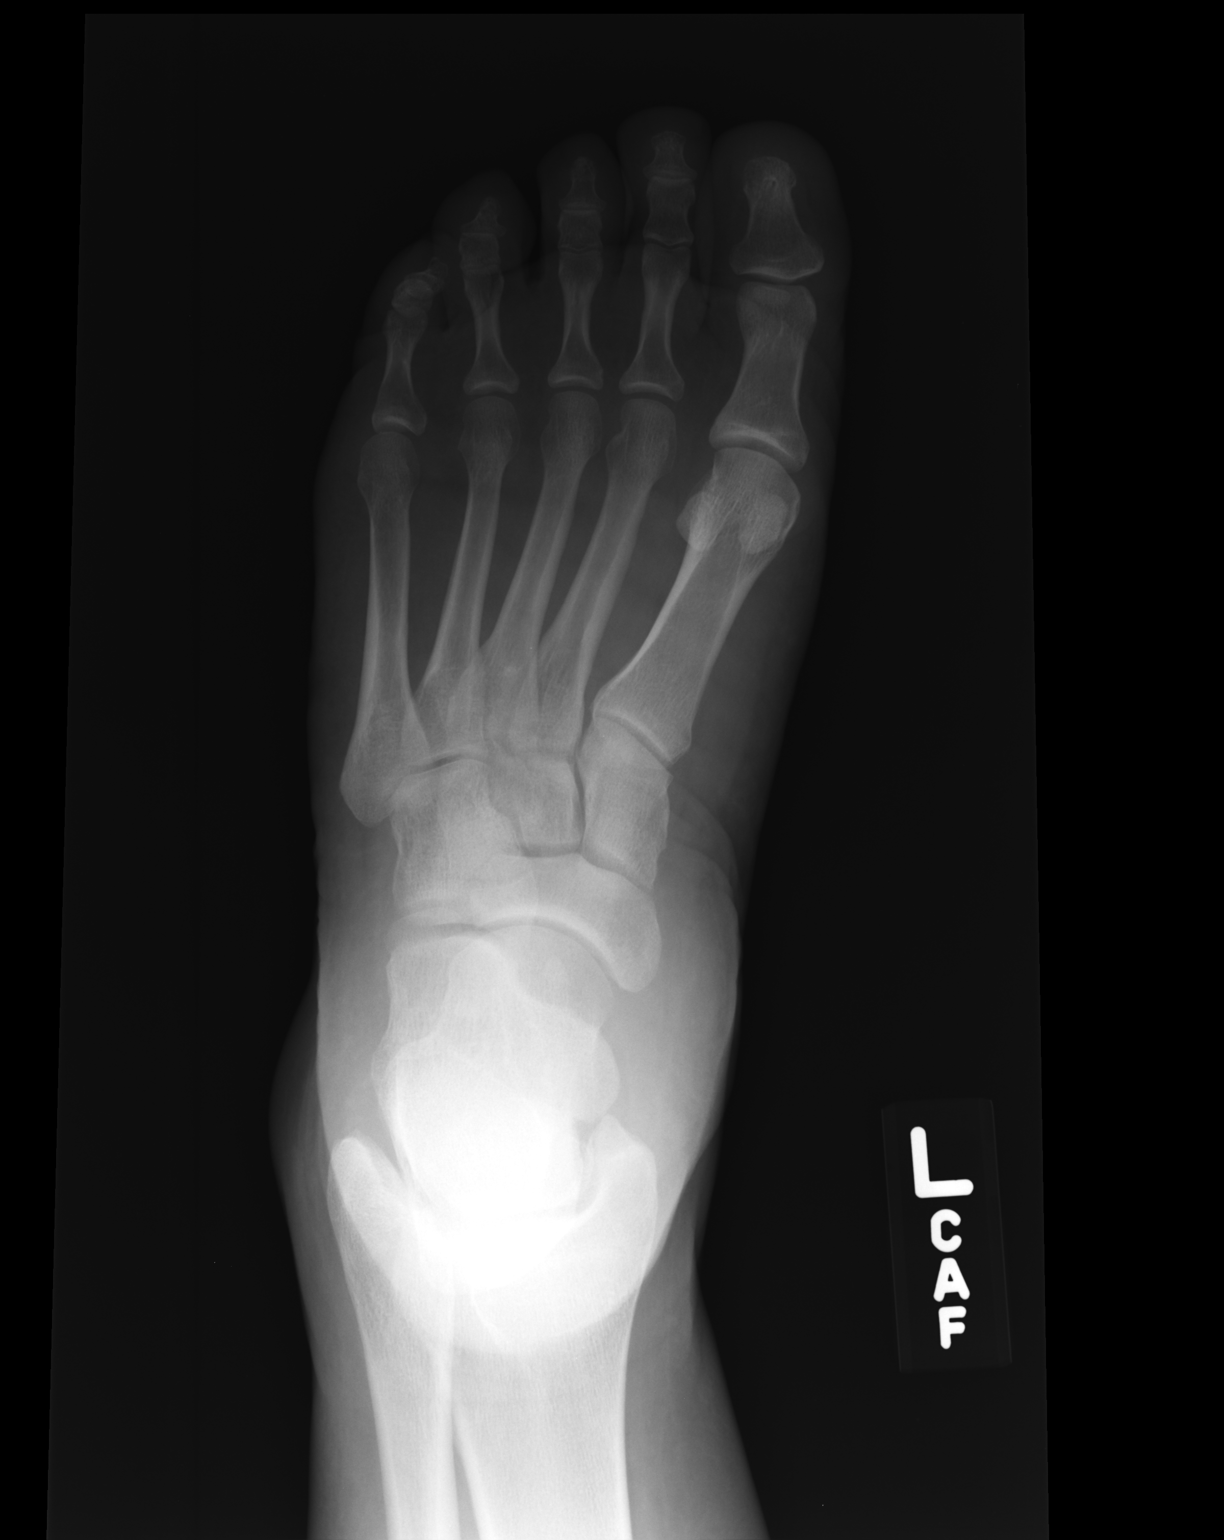

[ap obl int rot]
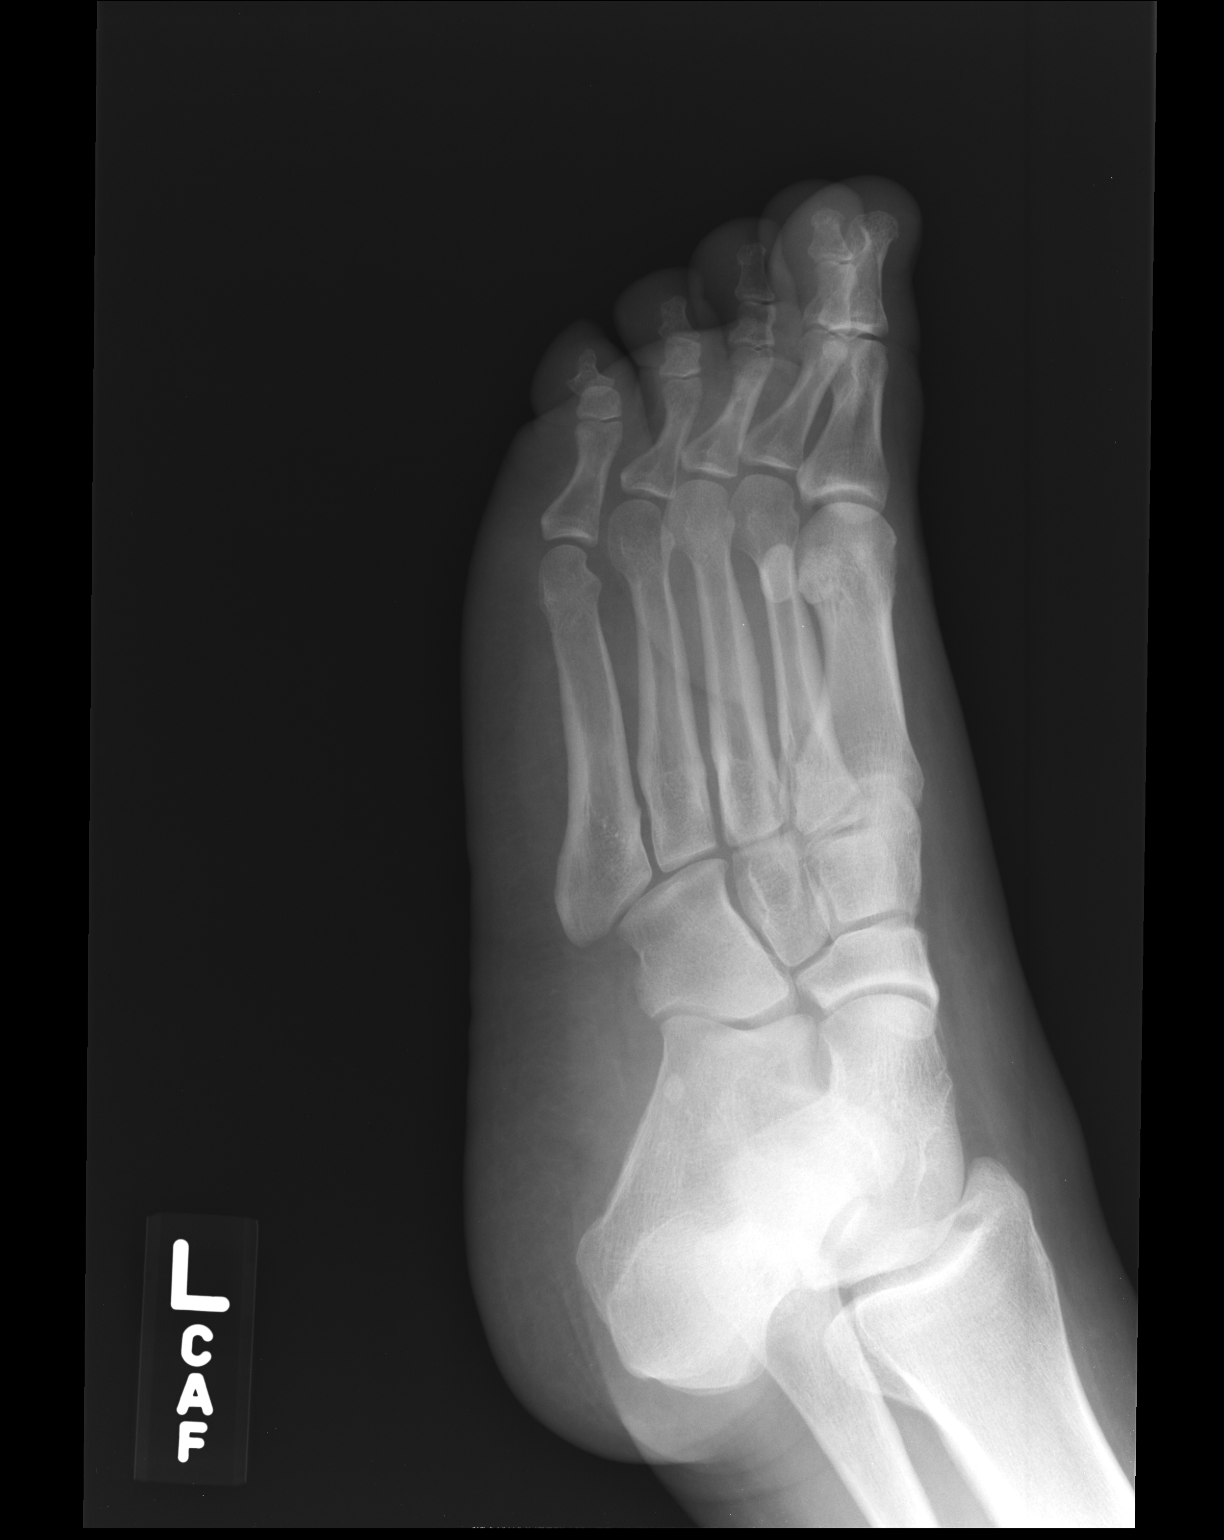

[lateral]
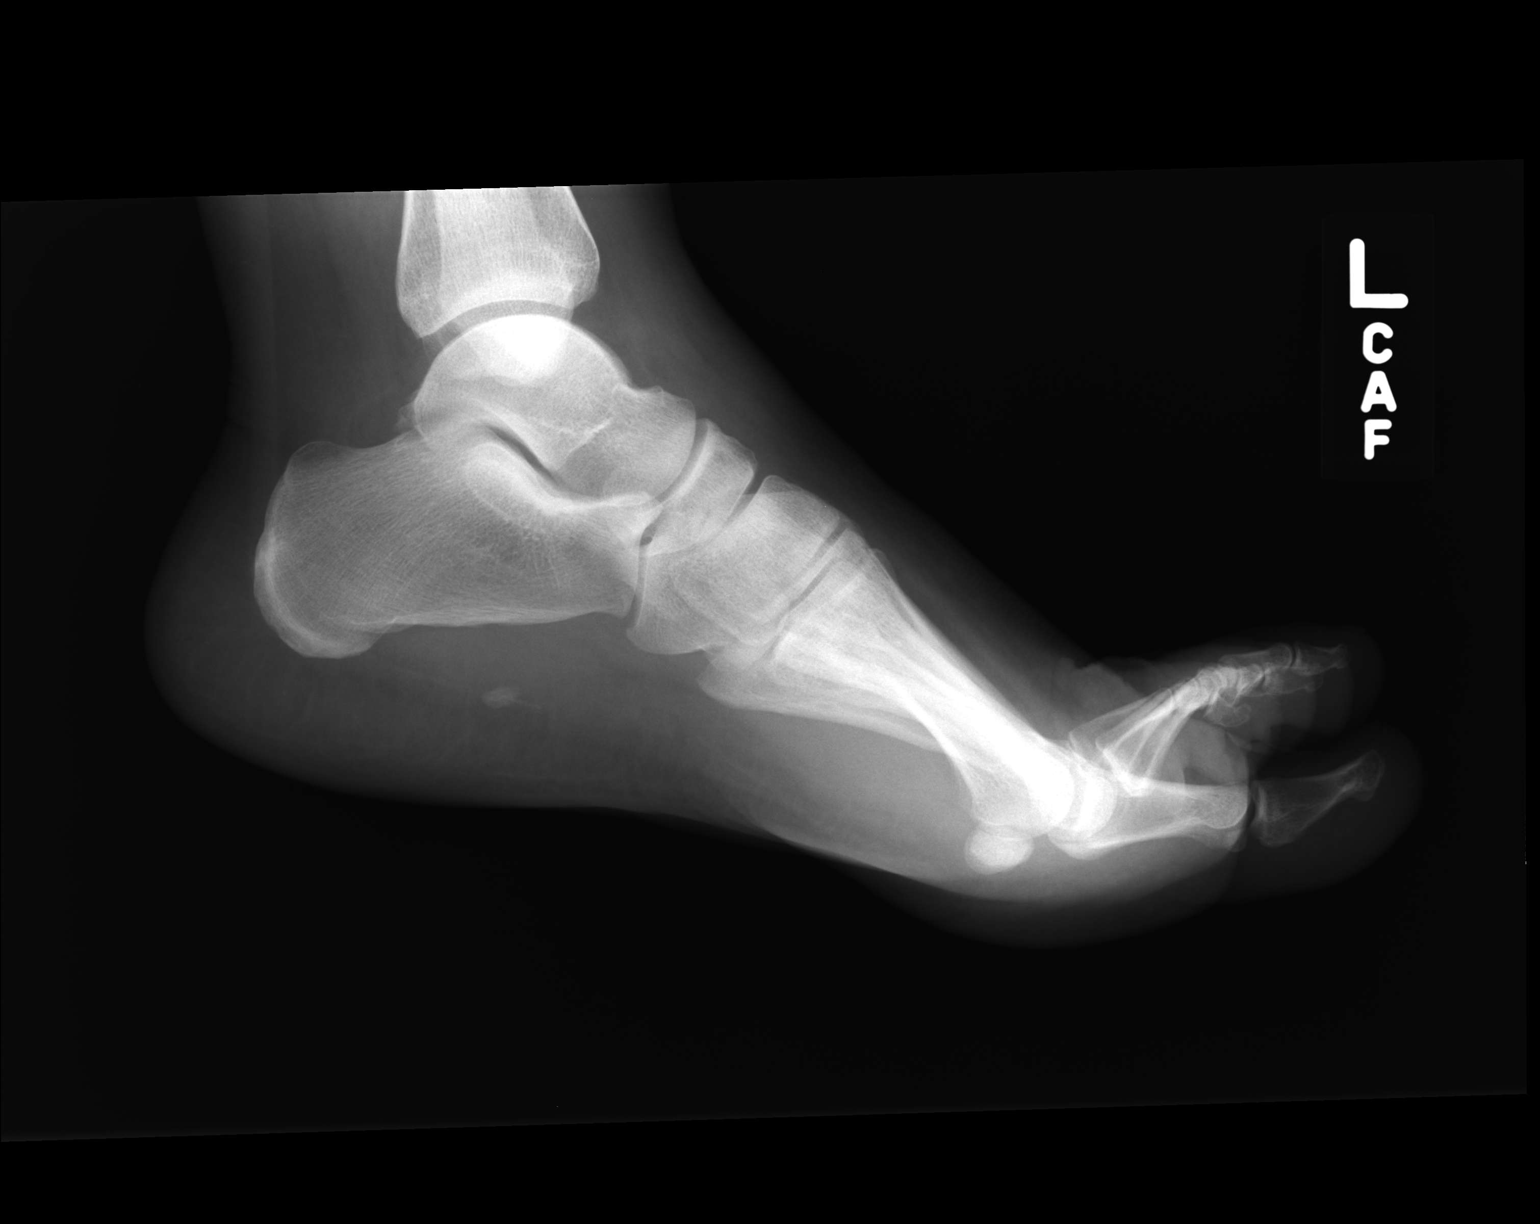

[3 of 3 positions shown; findings below may reference images not displayed]

FINDINGS: There is no evidence of fracture or dislocation. There is no
evidence of arthropathy or other focal bone abnormality. Soft
tissues are unremarkable.
IMPRESSION: Negative.

## 2015-10-17 ENCOUNTER — Encounter (INDEPENDENT_AMBULATORY_CARE_PROVIDER_SITE_OTHER): Payer: BLUE CROSS/BLUE SHIELD | Admitting: Podiatry

## 2015-10-17 DIAGNOSIS — M722 Plantar fascial fibromatosis: Secondary | ICD-10-CM

## 2015-10-17 NOTE — Patient Instructions (Signed)

## 2015-10-22 NOTE — Progress Notes (Signed)
This encounter was created in error - please disregard.

## 2015-11-14 ENCOUNTER — Encounter: Payer: Self-pay | Admitting: Podiatry

## 2015-11-14 ENCOUNTER — Ambulatory Visit (INDEPENDENT_AMBULATORY_CARE_PROVIDER_SITE_OTHER): Payer: BLUE CROSS/BLUE SHIELD

## 2015-11-14 ENCOUNTER — Ambulatory Visit (INDEPENDENT_AMBULATORY_CARE_PROVIDER_SITE_OTHER): Payer: BLUE CROSS/BLUE SHIELD | Admitting: Podiatry

## 2015-11-14 VITALS — BP 115/65 | HR 92 | Resp 16

## 2015-11-14 DIAGNOSIS — M722 Plantar fascial fibromatosis: Secondary | ICD-10-CM

## 2015-11-14 MED ORDER — MELOXICAM 15 MG PO TABS: 15.0000 mg | ORAL_TABLET | Freq: Every day | ORAL | Status: AC

## 2015-11-14 MED ORDER — METHYLPREDNISOLONE 4 MG PO TBPK: ORAL_TABLET | ORAL | Status: DC

## 2015-11-14 NOTE — Patient Instructions (Signed)

## 2015-11-14 NOTE — Progress Notes (Signed)
   Subjective:    Patient ID: Rhonda Phillips, female    DOB: 03-26-1981, 35 y.o.   MRN: [id number]  HPI: She presents today after having not been here for proximally for years. When she was last here she had plantar fasciitis and an EPF of the bilateral foot was performed. She states that she did well for 3-1/2 years and over the past couple of months has redeveloped pain in her plantar heel of her left foot. She states there is no longer on the medial side as she points to the inside with the surgery was performed. Rather she states it is on the very bottom. Mornings are particularly bad and she has been wearing her night splint regularly since the pain began. No changes in her past medical history medications or allergies other than diet pills.    Review of Systems  Neurological: Positive for headaches.  All other systems reviewed and are negative.      Objective:   Physical Exam: Vital signs are stable she is alert and oriented 3. No apparent distress. Very pleasant to discuss plans with. I have reviewed her past medical history medications allergy surgery social history and review of systems. Pulses are palpable bilateral neurologic sensorium is intact for Semmes-Weinstein monofilament. Deep tendon reflexes are intact bilateral muscle strength +5 over 5 dorsiflexors plantar flexors and inverters and everters all intrinsic musculature is intact. She has pain on palpation plantar central calcaneal tubercle of her left heel. None on the right foot. Radiographs confirm soft tissue increase in density plantar fascial calcaneal insertion site left heel. Cutaneous evaluation of a straight supple well-hydrated cutis no erythema edema cellulitis drainage or odor.        Assessment & Plan:  Central band plantar fasciitis left foot.  Plan: Injected left heel today with Kenalog and local anesthetic. Placed her in a plantar fascial brace. She was prescribed and methylprednisolone to be followed by  meloxicam. We discussed appropriate shoe gear stretching exercises ice therapy and shoe modifications. She was also scanned for set of orthotics.

## 2015-11-20 ENCOUNTER — Telehealth: Payer: Self-pay | Admitting: *Deleted

## 2015-11-20 NOTE — Telephone Encounter (Addendum)
Pt request handicap placard for use while being treated for left foot plantar fasciitis.  Dr. Al Corpus ordered 3 month handicap placard.  Message to M. Nunnally to complete form.

## 2015-11-27 ENCOUNTER — Encounter: Payer: Self-pay | Admitting: Podiatry

## 2015-11-27 ENCOUNTER — Telehealth: Payer: Self-pay | Admitting: *Deleted

## 2015-11-27 NOTE — Telephone Encounter (Signed)
Pt request handicap sticker to be mailed to her home.  Dr. Maren Beach 3 month handicap sticker.

## 2015-12-12 ENCOUNTER — Ambulatory Visit: Payer: BLUE CROSS/BLUE SHIELD | Admitting: Podiatry

## 2015-12-19 ENCOUNTER — Ambulatory Visit (INDEPENDENT_AMBULATORY_CARE_PROVIDER_SITE_OTHER): Payer: BLUE CROSS/BLUE SHIELD | Admitting: Podiatry

## 2015-12-19 ENCOUNTER — Encounter: Payer: Self-pay | Admitting: Podiatry

## 2015-12-19 VITALS — BP 99/60 | HR 104 | Resp 16

## 2015-12-19 DIAGNOSIS — M722 Plantar fascial fibromatosis: Secondary | ICD-10-CM | POA: Diagnosis not present

## 2015-12-20 NOTE — Progress Notes (Signed)
She presents today for follow-up of her left heel. She states that I'm really not having any pain any longer. She states that she continues her night splint her plantar fascial brace and tennis shoes. She also continues current medication.  Objective: Vital signs are stable alert and oriented 3. Pulses are palpable. She has minimal discomfort on palpation medial calcaneal tubercle of the left heel.  Assessment: Resolving plantar fasciitis at approximately 90%.  Plan: Will follow-up with me in 1 month if necessary. Continue conservative therapies.

## 2016-01-16 ENCOUNTER — Ambulatory Visit: Payer: BLUE CROSS/BLUE SHIELD | Admitting: Podiatry

## 2016-02-27 ENCOUNTER — Encounter: Payer: Self-pay | Admitting: Podiatry

## 2016-02-27 ENCOUNTER — Ambulatory Visit (INDEPENDENT_AMBULATORY_CARE_PROVIDER_SITE_OTHER): Payer: BLUE CROSS/BLUE SHIELD | Admitting: Podiatry

## 2016-02-27 VITALS — BP 110/68 | HR 118 | Resp 16

## 2016-02-27 DIAGNOSIS — M722 Plantar fascial fibromatosis: Secondary | ICD-10-CM

## 2016-02-27 MED ORDER — METHYLPREDNISOLONE 4 MG PO TBPK: ORAL_TABLET | ORAL | Status: DC

## 2016-02-27 NOTE — Progress Notes (Signed)
She presents today for follow-up of her left foot. She states that she was doing wonderful and then all of a sudden her left foot started to hurt again. She states that believe it hurts worse now than it did before we worked on it. She states that she feels that her exercise has increased in the shoe gear that she is wearing may not be sufficient to utilize with her new orthotics.  Objective: Vital signs are stable she is alert and oriented 3. Pulses are strongly palpable left. No calf pain. She has pain on palpation medial calcaneal tubercle of her left heel.  Assessment: Plantar fasciitis left heel.  Plan: I reinjected the area today and start her back on a Medrol Dosepak. I expressed to her if this does not work then we will consider surgical intervention on the left foot as we did on the right.

## 2016-03-05 ENCOUNTER — Telehealth: Payer: Self-pay | Admitting: *Deleted

## 2016-03-05 NOTE — Telephone Encounter (Signed)
Pt states Dr. Al CorpusHyatt said if she was still having pain she would need to schedule surgery, and she's at that point.  Pt states she would like to schedule surgery.

## 2016-03-17 NOTE — Telephone Encounter (Signed)
"  One and a half weeks ago I called to schedule surgery.  Call me back."    I'm returning your call.  You are going to need an appointment for a consultation.  When you come in for that we can get you scheduled for surgery.  "I already have an appointment on the 29th.  Do I need to come in sooner?"  No, the 29th will be fine.  "Okay, thank you."

## 2016-03-18 ENCOUNTER — Other Ambulatory Visit: Payer: Self-pay | Admitting: Obstetrics and Gynecology

## 2016-03-19 LAB — CYTOLOGY - PAP

## 2016-03-26 ENCOUNTER — Encounter: Payer: Self-pay | Admitting: Podiatry

## 2016-03-26 ENCOUNTER — Ambulatory Visit (INDEPENDENT_AMBULATORY_CARE_PROVIDER_SITE_OTHER): Payer: BLUE CROSS/BLUE SHIELD | Admitting: Podiatry

## 2016-03-26 DIAGNOSIS — M722 Plantar fascial fibromatosis: Secondary | ICD-10-CM | POA: Diagnosis not present

## 2016-03-26 NOTE — Progress Notes (Signed)
She presents today for follow-up of her plantar fasciitis for her left foot she states only for surgery just has not gotten any better.  Objective: Vital signs are stable she is alert and oriented 3. Pulses are palpable. Neurologic sensorium is intact. Degenerative flexor intact muscle strength is normal. She has severe pain on palpation medially over the left heel.  Assessment: Chronic internal plantar fasciitis left heel.  Plan: We consented her today for surgical endoscopic plantar fasciotomy of the left foot. I answered all the questions regarding this procedure to the best of my ability limits terms she understood this was amenable to an inside-out 3 patient the consent form. We did discuss the possible postop complications which may include but are not limited to postop pain bleeding swelling infection recurrence need for further surgery overcorrection and under correction worsening of the condition. We dispensed a cam walker until follow-up with her at the time of surgery.

## 2016-03-26 NOTE — Patient Instructions (Signed)

## 2016-04-03 DIAGNOSIS — M722 Plantar fascial fibromatosis: Secondary | ICD-10-CM

## 2016-04-16 ENCOUNTER — Other Ambulatory Visit: Payer: Self-pay | Admitting: Podiatry

## 2016-04-16 MED ORDER — PROMETHAZINE HCL 25 MG PO TABS: 25.0000 mg | ORAL_TABLET | Freq: Three times a day (TID) | ORAL | Status: AC | PRN

## 2016-04-16 MED ORDER — OXYCODONE-ACETAMINOPHEN 10-325 MG PO TABS: 1.0000 | ORAL_TABLET | ORAL | Status: AC | PRN

## 2016-04-16 MED ORDER — CEPHALEXIN 500 MG PO CAPS: 500.0000 mg | ORAL_CAPSULE | Freq: Three times a day (TID) | ORAL | Status: DC

## 2016-04-17 ENCOUNTER — Telehealth: Payer: Self-pay | Admitting: *Deleted

## 2016-04-17 DIAGNOSIS — M722 Plantar fascial fibromatosis: Secondary | ICD-10-CM | POA: Diagnosis not present

## 2016-04-17 MED ORDER — FLUCONAZOLE 150 MG PO TABS: 150.0000 mg | ORAL_TABLET | Freq: Once | ORAL | Status: AC

## 2016-04-17 NOTE — Telephone Encounter (Addendum)
Pt states she was given an antibiotic post op by Dr.Hyatt, and needs 2 yeast pills.  Dr. Al CorpusHyatt ordered Diflucan 150mg  #1 take as directed, +1refill. Orders to pt and pharmacy. 05/05/2016-Pt states the largest of the suture lines is more open than last week. Dr. Al CorpusHyatt states if the incision site borders are dry and no drainage use Aquaphor and light dressing, if incision borders are moist and a little drainage apply lightly covered bandaid with neosporin after the shower, then allow to air dry over night until healed, report any signs of infection.

## 2016-04-23 ENCOUNTER — Ambulatory Visit (INDEPENDENT_AMBULATORY_CARE_PROVIDER_SITE_OTHER): Payer: BLUE CROSS/BLUE SHIELD | Admitting: Podiatry

## 2016-04-23 ENCOUNTER — Encounter: Payer: Self-pay | Admitting: Podiatry

## 2016-04-23 VITALS — Temp 97.6°F

## 2016-04-23 DIAGNOSIS — Z9889 Other specified postprocedural states: Secondary | ICD-10-CM

## 2016-04-23 DIAGNOSIS — M722 Plantar fascial fibromatosis: Secondary | ICD-10-CM

## 2016-04-23 NOTE — Progress Notes (Signed)
Endoscopic plantar fasciotomy of the left foot. This had to be converted to an open fasciotomy with excision of an ossicle or area of calcification. She states that has done very well. She denies fever chills nausea vomiting muscle aches pains shortness of breath or chest pain.  Objective: Dry sterile dressing intact was removed demonstrates no erythema edema saline as drainage or odor. Sutures are intact margins are coapting well. Minimal pain on palpation.  Assessment: Well-healing surgical foot left.  Plan: Placed a light dressing today placed her back in her cam walker which she will continue to utilize for 24 hours a day until I see her next week sutures will be removed next Thursday. She will then be placed in a compression anklet and she will utilize her night splint for 1 month as she gets back into her tennis shoes during the day.

## 2016-04-30 ENCOUNTER — Ambulatory Visit (INDEPENDENT_AMBULATORY_CARE_PROVIDER_SITE_OTHER): Payer: BLUE CROSS/BLUE SHIELD | Admitting: Podiatry

## 2016-04-30 ENCOUNTER — Encounter: Payer: Self-pay | Admitting: Podiatry

## 2016-04-30 DIAGNOSIS — Z9889 Other specified postprocedural states: Secondary | ICD-10-CM

## 2016-04-30 NOTE — Progress Notes (Signed)
She presents today 2 weeks status post EPF of the left foot which was converted to an open. She states that she's doing very well she states that she has some soreness but no pain. She continues to wear her cam walker regularly.  Objective: Vital signs are stable she is alert and oriented 3. Pulses are palpable. Sutures are intact margins. We well coapted. I see no signs of infection. She is no pain on palpation. Sutures were removed margins remain well coapted.  Assessment: Well-healing surgical foot status post EPF left.  Plan: 1 requests that she allow a few days before she is this wet to make sure that she has no dehiscence of the wound. Also request that she continue the use of the Cam Walker day and night. Over the next week or 2 she will transition to a pair of tennis shoes as long as the medial aspect of the foot is closed and it is not bothering her. I will follow-up with her in 2 weeks. She will continue out of work until further notes.

## 2016-05-07 DIAGNOSIS — M722 Plantar fascial fibromatosis: Secondary | ICD-10-CM

## 2016-05-14 ENCOUNTER — Encounter: Payer: Self-pay | Admitting: Podiatry

## 2016-05-14 ENCOUNTER — Ambulatory Visit (INDEPENDENT_AMBULATORY_CARE_PROVIDER_SITE_OTHER): Payer: BLUE CROSS/BLUE SHIELD | Admitting: Podiatry

## 2016-05-14 DIAGNOSIS — M722 Plantar fascial fibromatosis: Secondary | ICD-10-CM

## 2016-05-14 DIAGNOSIS — Z9889 Other specified postprocedural states: Secondary | ICD-10-CM

## 2016-05-14 NOTE — Progress Notes (Signed)
She presents today for follow-up of her open fasciotomy left foot. She states that the wound opened and dehisced a little bit she says is still kind of touchy. However she states the fasciitis is completely resolved.  Objective: Vital signs are stable she is alert and oriented 3 no open lesions or wounds she still has a very small raw area to the medial incision site but no pain on palpation. I encouraged her to keep compression on the side.  Assessment: Well-healing surgical foot status post open fasciotomy.  Plan: Keep compression on the area make sure that he heals well follow up with me questions or concerns otherwise I'll see her in a month area

## 2016-05-28 ENCOUNTER — Encounter: Payer: Self-pay | Admitting: Podiatry

## 2016-05-28 ENCOUNTER — Ambulatory Visit (INDEPENDENT_AMBULATORY_CARE_PROVIDER_SITE_OTHER): Payer: BLUE CROSS/BLUE SHIELD | Admitting: Podiatry

## 2016-05-28 DIAGNOSIS — M722 Plantar fascial fibromatosis: Secondary | ICD-10-CM

## 2016-05-28 DIAGNOSIS — Z9889 Other specified postprocedural states: Secondary | ICD-10-CM

## 2016-05-28 NOTE — Progress Notes (Signed)
She presents today for follow-up of her open fasciotomy date of surgery 04/17/2016. She states that she's doing just great is still mildly tender.  Objective: Vital signs are stable alert and oriented 3. Pulses are palpable. Neurologic sensorium is intact for Semmes-Weinstein monofilament. Surgical site and already he'll eventually that she still has some swelling along the medial side of the foot. I encouraged range of motion exercises and massage therapy and cool compresses.  Assessment: Rhonda Phillips surgical foot left.  Plan: Follow up with me in 1 month.

## 2016-07-02 ENCOUNTER — Ambulatory Visit (INDEPENDENT_AMBULATORY_CARE_PROVIDER_SITE_OTHER): Payer: BLUE CROSS/BLUE SHIELD | Admitting: Podiatry

## 2016-07-02 DIAGNOSIS — Z9889 Other specified postprocedural states: Secondary | ICD-10-CM

## 2016-07-02 DIAGNOSIS — M722 Plantar fascial fibromatosis: Secondary | ICD-10-CM

## 2016-07-02 NOTE — Progress Notes (Signed)
She presents today for her final postop visit regarding endoscopic fasciotomy which was converted to an open fasciotomy with excision of bone left heel. She states that she is doing very well and has no problems. She states that she's recently been diagnosed with rheumatoid arthritis. Her left foot is doing much better. Date of surgery 04/17/2016.  Objective: No pain on palpation surgical foot left minimal edema and no saline as drainage or odor. No open lesions or wounds.  Assessment: Well-healed surgical foot status post open fasciotomy left.  Plan: Follow up with her on an as-needed basis.
